# Patient Record
Sex: Female | Born: 1950 | Race: White | Hispanic: No | Marital: Married
Health system: Southern US, Community
[De-identification: ages and names within clinical notes are randomized; demographics above are authoritative.]

---

## 2010-04-05 ENCOUNTER — Ambulatory Visit: Payer: Self-pay | Admitting: Unknown Physician Specialty

## 2010-04-05 ENCOUNTER — Ambulatory Visit: Payer: Self-pay | Admitting: Gynecologic Oncology

## 2010-04-28 ENCOUNTER — Ambulatory Visit: Payer: Self-pay | Admitting: Unknown Physician Specialty

## 2010-04-28 ENCOUNTER — Ambulatory Visit: Payer: Self-pay | Admitting: Gynecologic Oncology

## 2010-05-03 ENCOUNTER — Ambulatory Visit: Payer: Self-pay | Admitting: Obstetrics and Gynecology

## 2010-05-05 ENCOUNTER — Inpatient Hospital Stay: Payer: Self-pay | Admitting: Obstetrics and Gynecology

## 2011-08-16 ENCOUNTER — Ambulatory Visit: Payer: Self-pay | Admitting: Unknown Physician Specialty

## 2012-08-16 ENCOUNTER — Ambulatory Visit: Payer: Self-pay | Admitting: Unknown Physician Specialty

## 2013-05-02 ENCOUNTER — Ambulatory Visit: Payer: Self-pay | Admitting: Family Medicine

## 2013-07-31 ENCOUNTER — Ambulatory Visit: Payer: Self-pay | Admitting: Surgery

## 2013-07-31 DIAGNOSIS — E119 Type 2 diabetes mellitus without complications: Secondary | ICD-10-CM

## 2013-07-31 DIAGNOSIS — I10 Essential (primary) hypertension: Secondary | ICD-10-CM

## 2013-07-31 LAB — CBC WITH DIFFERENTIAL/PLATELET
Basophil #: 0.1 10*3/uL (ref 0.0–0.1)
Basophil %: 1 %
EOS ABS: 0.1 10*3/uL (ref 0.0–0.7)
Eosinophil %: 1.6 %
HCT: 42.1 % (ref 35.0–47.0)
HGB: 14.4 g/dL (ref 12.0–16.0)
LYMPHS PCT: 33 %
Lymphocyte #: 2.9 10*3/uL (ref 1.0–3.6)
MCH: 30.9 pg (ref 26.0–34.0)
MCHC: 34.2 g/dL (ref 32.0–36.0)
MCV: 90 fL (ref 80–100)
Monocyte #: 0.6 x10 3/mm (ref 0.2–0.9)
Monocyte %: 6.3 %
NEUTROS ABS: 5.1 10*3/uL (ref 1.4–6.5)
NEUTROS PCT: 58.1 %
PLATELETS: 305 10*3/uL (ref 150–440)
RBC: 4.67 10*6/uL (ref 3.80–5.20)
RDW: 12.6 % (ref 11.5–14.5)
WBC: 8.8 10*3/uL (ref 3.6–11.0)

## 2013-07-31 LAB — BASIC METABOLIC PANEL
Anion Gap: 3 — ABNORMAL LOW (ref 7–16)
BUN: 17 mg/dL (ref 7–18)
CREATININE: 0.66 mg/dL (ref 0.60–1.30)
Calcium, Total: 8.9 mg/dL (ref 8.5–10.1)
Chloride: 102 mmol/L (ref 98–107)
Co2: 32 mmol/L (ref 21–32)
GLUCOSE: 77 mg/dL (ref 65–99)
Osmolality: 274 (ref 275–301)
Potassium: 3.1 mmol/L — ABNORMAL LOW (ref 3.5–5.1)
SODIUM: 137 mmol/L (ref 136–145)

## 2013-08-07 ENCOUNTER — Ambulatory Visit: Payer: Self-pay | Admitting: Surgery

## 2013-12-11 ENCOUNTER — Ambulatory Visit: Payer: Self-pay | Admitting: Family Medicine

## 2014-09-19 NOTE — Op Note (Signed)
PATIENT NAME:  Rachel Hartman, Rachel Hartman MR#:  [id number] DATE OF BIRTH:  08-18-1950  DATE OF PROCEDURE:  08/07/2013  ATTENDING PHYSICIAN:  Harrell Gave A. Tameya Kuznia, MD  PREOPERATIVE DIAGNOSIS:  Spigelian hernia and umbilical hernia.   POSTOPERATIVE DIAGNOSIS:  Incarcerated Spigelian hernia and umbilical hernia.   PROCEDURE PERFORMED:  1.  Laparoscopic Spigelian hernia repair with mesh.  2.  Open umbilical hernia repair.   SPECIMENS:  None.   ESTIMATED BLOOD LOSS:  15 mL.  COMPLICATIONS:  None.   ANESTHESIA:  General endotracheal.   INDICATION FOR SURGERY:  [name] is a pleasant 64 year old female who presented with a left abdominal bulge at a site of a previous laparoscopic port site. She had a CT scan which showed a Spigelian hernia as well as an umbilical hernia. She was brought to the Operating Room for definitive management of both.   DETAILS OF PROCEDURE:  Informed consent was obtained. [name] was brought to the Operating Room suite. She was placed supine on the Operating Room table. She was induced. An endotracheal tube was placed, general anesthesia was administered. Her abdomen was then prepped and draped in standard surgical fashion. A time-out was then performed correctly identifying the patient name, operative site and procedure to be performed. A supraumbilical incision was made. It was deepened to the fascia. The fascia was incised. The peritoneum was entered. There were no adhesions. Two stay sutures were placed through the fasciotomy. Hasson trocar was placed in the abdomen. The abdomen was insufflated. A camera was placed through the trocar. There was a noted hernia lateral and midline in the left lower quadrant. A 5 mm trocar was then placed in right lower quadrant and the site was reduced carefully. There were also some adhesions to the abdominal wall likely from previous surgery. These were cleared out as well. What remained was a 3 x 3 cm fascial defect. I then proceeded to  place an 11-mm Bard polypropylene mesh with absorbable covering-Ventralex patch in the left lower quadrant. A small incision was made over the skin over the hernia defect. A transvaginal suture device was placed through this and the insufflation tubing with the Bard mesh, which contained a placement balloon, was pulled up, thus securing the mesh to the abdominal wall. The balloon inflated to provide a scaffold. The two layers of tacks were then used to attach the mesh to the abdominal wall. Once I was happy with the mesh placement, the balloon was then pulled out through the supraumbilical port. I then used a camera through the right lower quadrant port and looked at the umbilical hernia. It was small but present and no fat was needed to be reduced. I then removed the right lower quadrant port under direct visualization. The supraumbilical port was then closed with a 0 Vicryl figure-of-eight. I then made a supraumbilical incision in the place of the previous operative incision. This was deepened down to the fascia. The umbilicus was encircled with a Kelly clamp. It was then divided. There was approximately a 0.7 cm fascial defect. This was closed with an interrupted 0 Ethibond transversely. The umbilicus was then retacked to the fascia repair with interrupted 3-0 Vicryl and all port sites were then irrigated and infiltrated with 1% lidocaine. They were closed in the supraumbilical with a running 4-0 Monocryl and a 3-0 Vicryl deep dermal. I then used a 4-0 Monocryl interrupted to close the other port sites. As the patient had TAPE ALLERGY, a sterile gauze was then placed over the  wounds and the abdominal binder was placed over the sterile gauze. The patient was then awoken, extubated and brought to the postanesthesia care unit. There were no immediate complications. Needle, sponge, and instrument counts were correct at the end of the procedure.   ____________________________ Glena Norfolk. Cheyann Blecha,  MD cal:jm D: 08/08/2013 19:28:31 ET T: 08/09/2013 12:37:32 ET JOB#: [id number]  cc: Harrell Gave A. Gautam Langhorst, MD, <Dictator> Floyde Parkins MD ELECTRONICALLY SIGNED 08/10/2013 19:22 Floyde Parkins MD ELECTRONICALLY SIGNED 08/10/2013 19:23

## 2014-12-14 ENCOUNTER — Other Ambulatory Visit: Payer: Self-pay | Admitting: Physician Assistant

## 2014-12-14 DIAGNOSIS — Z1231 Encounter for screening mammogram for malignant neoplasm of breast: Secondary | ICD-10-CM

## 2014-12-17 ENCOUNTER — Ambulatory Visit
Admission: RE | Admit: 2014-12-17 | Discharge: 2014-12-17 | Disposition: A | Discharge status: Home / Self Care | Payer: BLUE CROSS/BLUE SHIELD | Source: Ambulatory Visit | Attending: Physician Assistant | Admitting: Physician Assistant

## 2014-12-17 DIAGNOSIS — Z1231 Encounter for screening mammogram for malignant neoplasm of breast: Secondary | ICD-10-CM | POA: Diagnosis present

## 2015-12-01 ENCOUNTER — Other Ambulatory Visit: Payer: Self-pay | Admitting: Physician Assistant

## 2015-12-01 DIAGNOSIS — Z1231 Encounter for screening mammogram for malignant neoplasm of breast: Secondary | ICD-10-CM

## 2016-01-04 ENCOUNTER — Ambulatory Visit
Admission: RE | Admit: 2016-01-04 | Discharge: 2016-01-04 | Disposition: A | Discharge status: Home / Self Care | Payer: Medicare Other | Source: Ambulatory Visit | Attending: Physician Assistant | Admitting: Physician Assistant

## 2016-01-04 ENCOUNTER — Other Ambulatory Visit: Payer: Self-pay | Admitting: Physician Assistant

## 2016-01-04 DIAGNOSIS — R928 Other abnormal and inconclusive findings on diagnostic imaging of breast: Secondary | ICD-10-CM | POA: Insufficient documentation

## 2016-01-04 DIAGNOSIS — Z1231 Encounter for screening mammogram for malignant neoplasm of breast: Secondary | ICD-10-CM

## 2016-01-05 ENCOUNTER — Other Ambulatory Visit: Payer: Self-pay | Admitting: Physician Assistant

## 2016-01-05 DIAGNOSIS — N6310 Unspecified lump in the right breast, unspecified quadrant: Secondary | ICD-10-CM

## 2016-01-14 ENCOUNTER — Ambulatory Visit
Admission: RE | Admit: 2016-01-14 | Discharge: 2016-01-14 | Disposition: A | Discharge status: Home / Self Care | Payer: Medicare Other | Source: Ambulatory Visit | Attending: Physician Assistant | Admitting: Physician Assistant

## 2016-01-14 DIAGNOSIS — N6310 Unspecified lump in the right breast, unspecified quadrant: Secondary | ICD-10-CM

## 2016-01-14 DIAGNOSIS — N63 Unspecified lump in breast: Secondary | ICD-10-CM | POA: Diagnosis not present

## 2016-07-18 ENCOUNTER — Other Ambulatory Visit: Payer: Self-pay | Admitting: Physician Assistant

## 2016-07-18 DIAGNOSIS — R928 Other abnormal and inconclusive findings on diagnostic imaging of breast: Secondary | ICD-10-CM

## 2016-08-15 ENCOUNTER — Ambulatory Visit
Admission: RE | Admit: 2016-08-15 | Discharge: 2016-08-15 | Disposition: A | Discharge status: Home / Self Care | Payer: Medicare Other | Source: Ambulatory Visit | Attending: Physician Assistant | Admitting: Physician Assistant

## 2016-08-15 DIAGNOSIS — R928 Other abnormal and inconclusive findings on diagnostic imaging of breast: Secondary | ICD-10-CM

## 2017-01-09 ENCOUNTER — Other Ambulatory Visit: Payer: Self-pay | Admitting: Physician Assistant

## 2017-01-09 DIAGNOSIS — R928 Other abnormal and inconclusive findings on diagnostic imaging of breast: Secondary | ICD-10-CM

## 2017-02-16 ENCOUNTER — Ambulatory Visit
Admission: RE | Admit: 2017-02-16 | Discharge: 2017-02-16 | Disposition: A | Discharge status: Home / Self Care | Payer: Medicare Other | Source: Ambulatory Visit | Attending: Physician Assistant | Admitting: Physician Assistant

## 2017-02-16 DIAGNOSIS — R928 Other abnormal and inconclusive findings on diagnostic imaging of breast: Secondary | ICD-10-CM

## 2017-02-16 DIAGNOSIS — N6313 Unspecified lump in the right breast, lower outer quadrant: Secondary | ICD-10-CM | POA: Diagnosis not present

## 2017-10-31 IMAGING — MG MM DIGITAL SCREENING BILAT W/ TOMO W/ CAD
8 of 13 series · 8 of 29 positions shown · non-contrast
Comparison: Previous exam(s).

CLINICAL DATA: Screening.

EXAM:
2D DIGITAL SCREENING BILATERAL MAMMOGRAM WITH CAD AND ADJUNCT TOMO

[R MLO (1 of 2)]
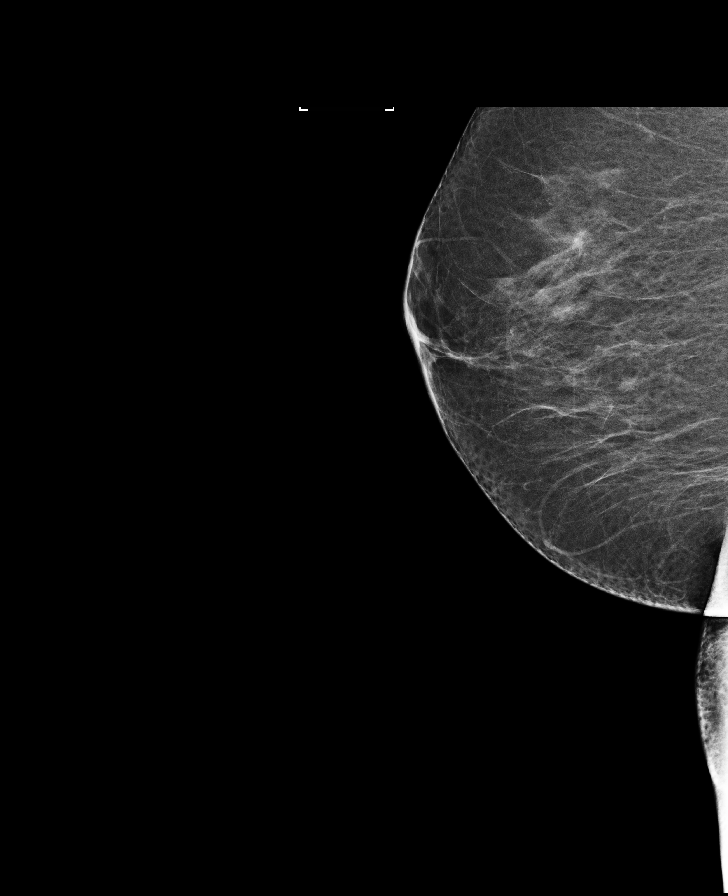

[R MLO (2 of 2)]
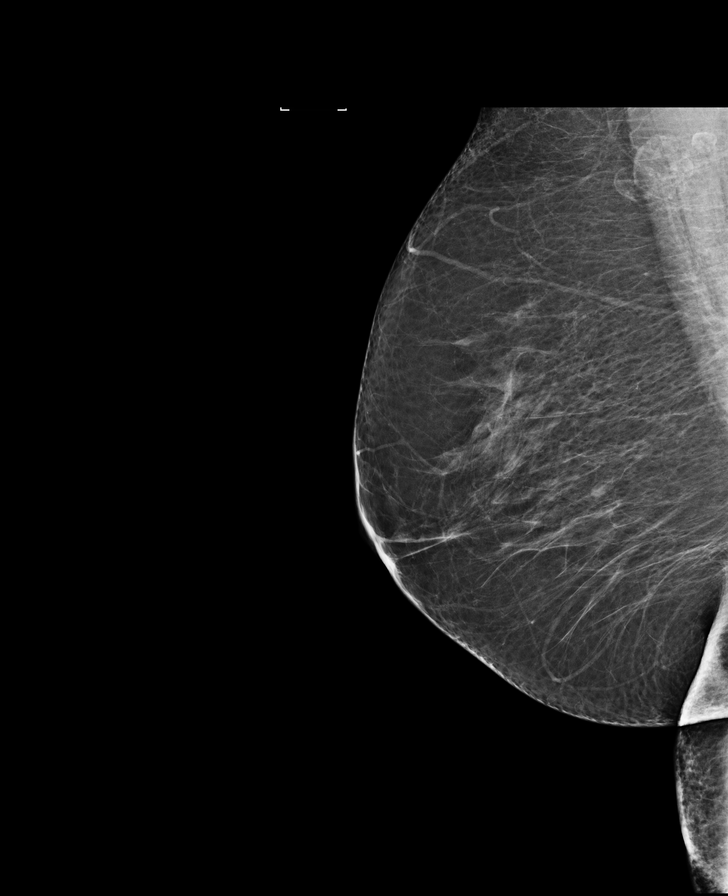

[L MLO]
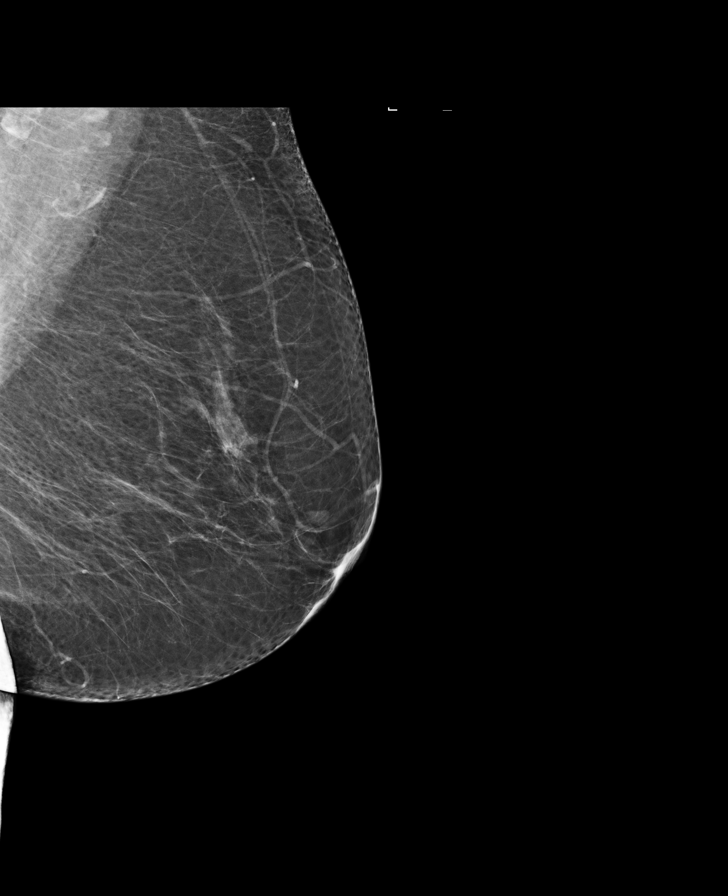

[L CC]
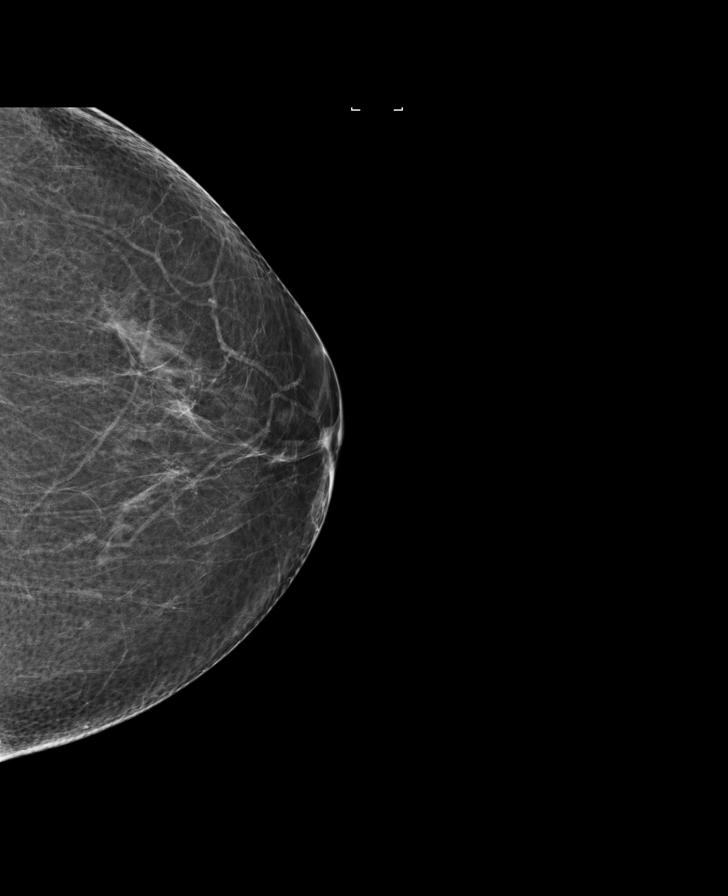

[R CC]
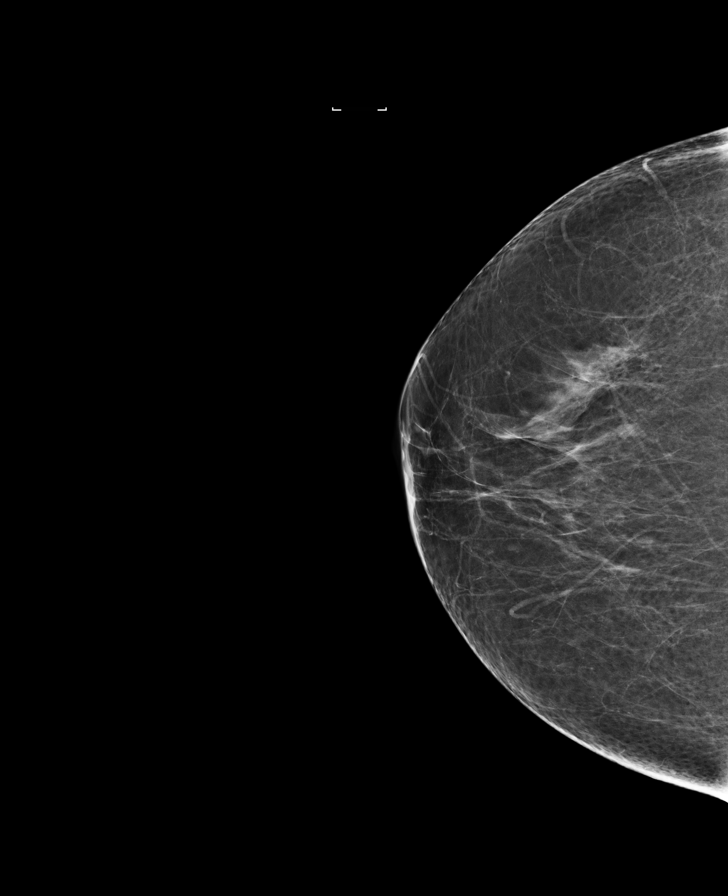

[R MLO synth-2D]
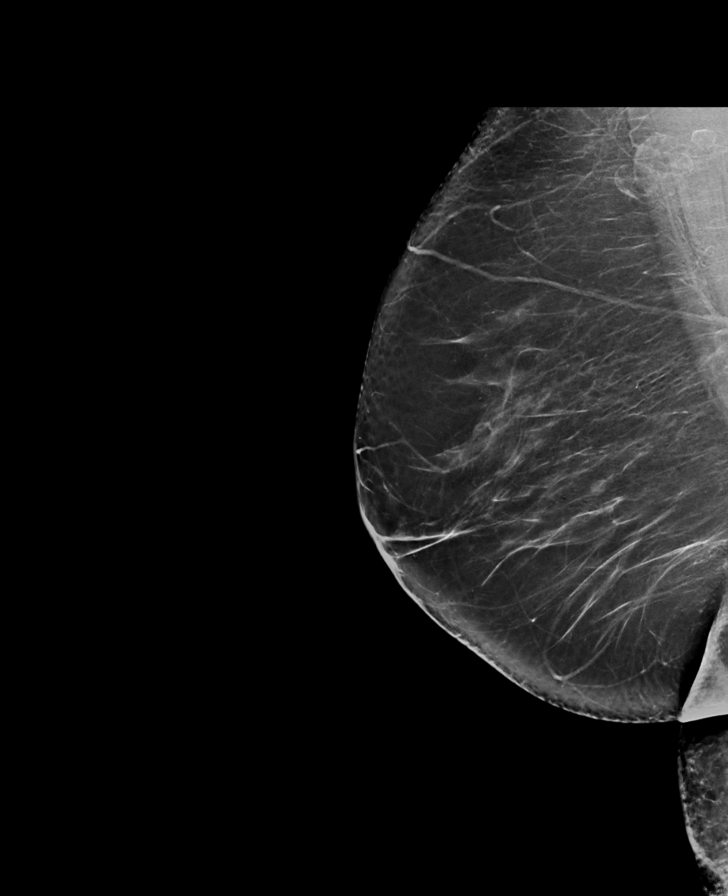

[L CC synth-2D]
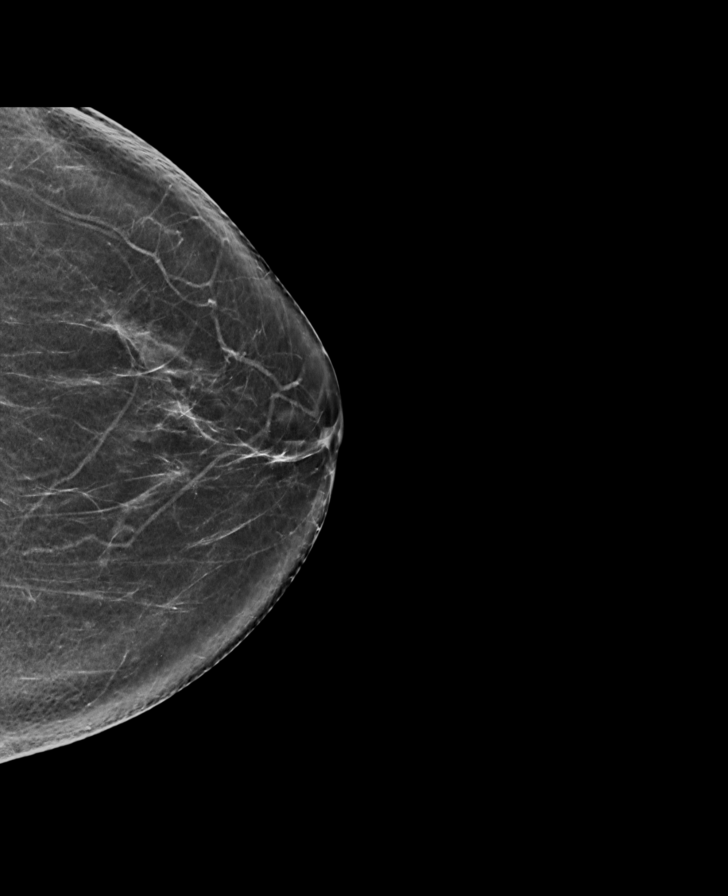

[L MLO synth-2D]
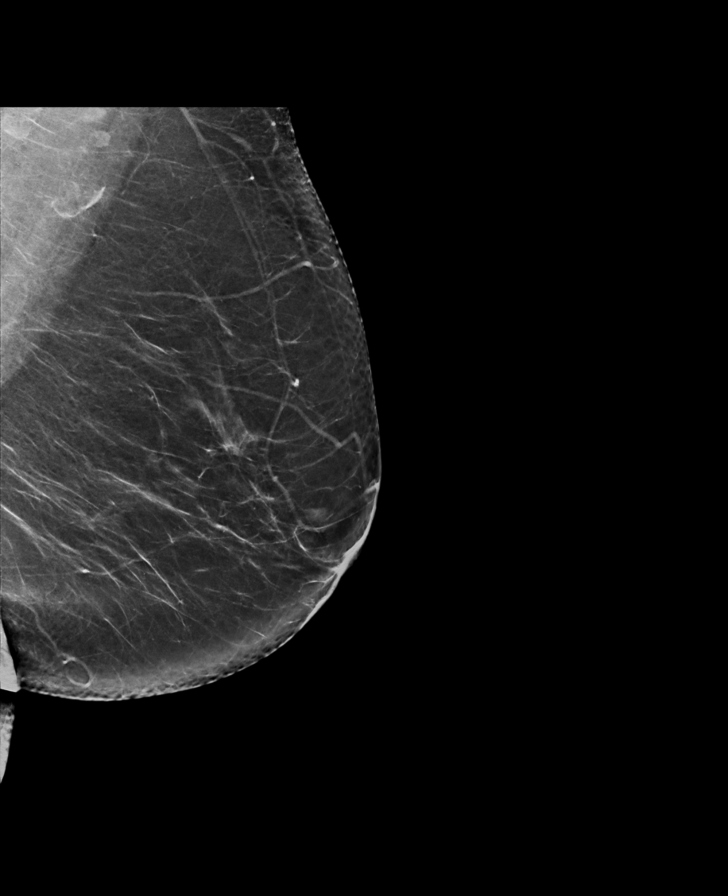

[8 of 29 positions shown; findings below may reference images not displayed]

ACR Breast Density Category b: There are scattered areas of
fibroglandular density.
FINDINGS: In the right breast, a possible mass warrants further evaluation. In
the left breast, no findings suspicious for malignancy. Images were
processed with CAD.
IMPRESSION: Further evaluation is suggested for possible mass in the right
breast.

RECOMMENDATION:
Diagnostic mammogram and possibly ultrasound of the right breast.
(Code:TP-K-77F)

The patient will be contacted regarding the findings, and additional
imaging will be scheduled.

BI-RADS CATEGORY  0: Incomplete. Need additional imaging evaluation
and/or prior mammograms for comparison.

## 2018-01-11 ENCOUNTER — Other Ambulatory Visit: Payer: Self-pay | Admitting: Nurse Practitioner

## 2018-01-11 DIAGNOSIS — N6310 Unspecified lump in the right breast, unspecified quadrant: Secondary | ICD-10-CM

## 2018-02-19 ENCOUNTER — Ambulatory Visit
Admission: RE | Admit: 2018-02-19 | Discharge: 2018-02-19 | Disposition: A | Discharge status: Home / Self Care | Payer: Medicare Other | Source: Ambulatory Visit | Attending: Nurse Practitioner | Admitting: Nurse Practitioner

## 2018-02-19 DIAGNOSIS — N6310 Unspecified lump in the right breast, unspecified quadrant: Secondary | ICD-10-CM | POA: Insufficient documentation

## 2018-12-13 ENCOUNTER — Other Ambulatory Visit: Payer: Self-pay | Admitting: Nurse Practitioner

## 2018-12-13 DIAGNOSIS — R928 Other abnormal and inconclusive findings on diagnostic imaging of breast: Secondary | ICD-10-CM

## 2019-02-21 ENCOUNTER — Ambulatory Visit
Admission: RE | Admit: 2019-02-21 | Discharge: 2019-02-21 | Disposition: A | Discharge status: Home / Self Care | Payer: Medicare Other | Source: Ambulatory Visit | Attending: Nurse Practitioner | Admitting: Nurse Practitioner

## 2019-02-21 DIAGNOSIS — R928 Other abnormal and inconclusive findings on diagnostic imaging of breast: Secondary | ICD-10-CM | POA: Diagnosis not present

## 2020-03-30 ENCOUNTER — Other Ambulatory Visit: Payer: Self-pay | Admitting: Nurse Practitioner

## 2020-03-30 DIAGNOSIS — Z1231 Encounter for screening mammogram for malignant neoplasm of breast: Secondary | ICD-10-CM

## 2020-04-15 ENCOUNTER — Other Ambulatory Visit: Payer: Self-pay

## 2020-04-15 ENCOUNTER — Ambulatory Visit (INDEPENDENT_AMBULATORY_CARE_PROVIDER_SITE_OTHER): Payer: Medicare Other | Admitting: Dermatology

## 2020-04-15 DIAGNOSIS — Z1283 Encounter for screening for malignant neoplasm of skin: Secondary | ICD-10-CM | POA: Diagnosis not present

## 2020-04-15 DIAGNOSIS — L814 Other melanin hyperpigmentation: Secondary | ICD-10-CM

## 2020-04-15 DIAGNOSIS — D229 Melanocytic nevi, unspecified: Secondary | ICD-10-CM

## 2020-04-15 DIAGNOSIS — L918 Other hypertrophic disorders of the skin: Secondary | ICD-10-CM

## 2020-04-15 DIAGNOSIS — L578 Other skin changes due to chronic exposure to nonionizing radiation: Secondary | ICD-10-CM

## 2020-04-15 DIAGNOSIS — L720 Epidermal cyst: Secondary | ICD-10-CM | POA: Diagnosis not present

## 2020-04-15 DIAGNOSIS — L821 Other seborrheic keratosis: Secondary | ICD-10-CM

## 2020-04-15 DIAGNOSIS — D1800 Hemangioma unspecified site: Secondary | ICD-10-CM

## 2020-04-15 NOTE — Progress Notes (Signed)
   Follow-Up Visit   Subjective  Rachel Hartman is a 69 y.o. female who presents for the following: Annual Exam (Total body skin exam, 2 yr f/u). The patient presents for Total-Body Skin Exam (TBSE) for skin cancer screening and mole check.  Patient accompanied by husband who contributes to history.  The following portions of the chart were reviewed this encounter and updated as appropriate:  Allergies  Meds  Problems  Med Hx  Surg Hx  Fam Hx     Review of Systems:  No other skin or systemic complaints except as noted in HPI or Assessment and Plan.  Objective  Well appearing patient in no apparent distress; mood and affect are within normal limits.  A full examination was performed including scalp, head, eyes, ears, nose, lips, neck, chest, axillae, abdomen, back, buttocks, bilateral upper extremities, bilateral lower extremities, hands, feet, fingers, toes, fingernails, and toenails. All findings within normal limits unless otherwise noted below.  Objective  Neck: Fleshy, skin-colored pedunculated papules.    Objective  R forehead: 3.44mm cystic pap   Assessment & Plan    Lentigines - Scattered tan macules - Discussed due to sun exposure - Benign, observe - Call for any changes  Seborrheic Keratoses - Stuck-on, waxy, tan-brown papules and plaques  - Discussed benign etiology and prognosis. - Observe - Call for any changes  Melanocytic Nevi - Tan-brown and/or pink-flesh-colored symmetric macules and papules - Benign appearing on exam today - Observation - Call clinic for new or changing moles - Recommend daily use of broad spectrum spf 30+ sunscreen to sun-exposed areas.   Hemangiomas - Red papules - Discussed benign nature - Observe - Call for any changes  Actinic Damage - Chronic, secondary to cumulative UV/sun exposure - diffuse scaly erythematous macules with underlying dyspigmentation - Recommend daily broad spectrum sunscreen SPF 30+ to  sun-exposed areas, reapply every 2 hours as needed.  - Call for new or changing lesions.  Skin cancer screening performed today.  Skin tag Neck  Benign, observe  Epidermal cyst R forehead  Benign-appearing. Exam most consistent with an epidermal inclusion cyst. Discussed that a cyst is a benign growth that can grow over time and sometimes get irritated or inflamed. Recommend observation if it is not bothersome. Discussed option of surgical excision to remove it if it is growing, symptomatic, or other changes noted. Please call for new or changing lesions so they can be evaluated.    Skin cancer screening  Return in about 2 years (around 04/15/2022) for TBSE.   I, Othelia Pulling, RMA, am acting as scribe for Sarina Ser, MD .  Documentation: I have reviewed the above documentation for accuracy and completeness, and I agree with the above.  Sarina Ser, MD

## 2020-04-16 ENCOUNTER — Encounter: Payer: Self-pay | Admitting: Dermatology

## 2020-07-28 ENCOUNTER — Ambulatory Visit
Admission: RE | Admit: 2020-07-28 | Discharge: 2020-07-28 | Disposition: A | Discharge status: Home / Self Care | Payer: Medicare Other | Source: Ambulatory Visit | Attending: Nurse Practitioner | Admitting: Nurse Practitioner

## 2020-07-28 ENCOUNTER — Other Ambulatory Visit: Payer: Self-pay

## 2020-07-28 DIAGNOSIS — Z1231 Encounter for screening mammogram for malignant neoplasm of breast: Secondary | ICD-10-CM | POA: Insufficient documentation

## 2022-06-13 ENCOUNTER — Other Ambulatory Visit: Payer: Self-pay | Admitting: Nurse Practitioner

## 2022-06-13 DIAGNOSIS — Z1231 Encounter for screening mammogram for malignant neoplasm of breast: Secondary | ICD-10-CM

## 2022-06-13 DIAGNOSIS — Z13820 Encounter for screening for osteoporosis: Secondary | ICD-10-CM

## 2022-09-21 ENCOUNTER — Ambulatory Visit
Admission: RE | Admit: 2022-09-21 | Discharge: 2022-09-21 | Disposition: A | Discharge status: Home / Self Care | Payer: Medicare Other | Source: Ambulatory Visit | Attending: Nurse Practitioner | Admitting: Nurse Practitioner

## 2022-09-21 DIAGNOSIS — Z1231 Encounter for screening mammogram for malignant neoplasm of breast: Secondary | ICD-10-CM | POA: Diagnosis present

## 2022-09-21 DIAGNOSIS — M8589 Other specified disorders of bone density and structure, multiple sites: Secondary | ICD-10-CM | POA: Insufficient documentation

## 2022-09-21 DIAGNOSIS — Z13820 Encounter for screening for osteoporosis: Secondary | ICD-10-CM

## 2022-09-21 DIAGNOSIS — Z780 Asymptomatic menopausal state: Secondary | ICD-10-CM | POA: Insufficient documentation

## 2022-12-20 ENCOUNTER — Ambulatory Visit: Payer: Medicare Other | Admitting: Dermatology
